# Patient Record
Sex: Female | Born: 1945 | Race: White | Hispanic: No | Marital: Married
Health system: Southern US, Community
[De-identification: ages and names within clinical notes are randomized; demographics above are authoritative.]

---

## 1999-08-16 ENCOUNTER — Ambulatory Visit (HOSPITAL_COMMUNITY): Admission: RE | Admit: 1999-08-16 | Discharge: 1999-08-16 | Payer: Self-pay | Admitting: *Deleted

## 2000-01-03 ENCOUNTER — Encounter: Payer: Self-pay | Admitting: Obstetrics and Gynecology

## 2000-01-03 ENCOUNTER — Encounter: Admission: RE | Admit: 2000-01-03 | Discharge: 2000-01-03 | Payer: Self-pay | Admitting: Obstetrics and Gynecology

## 2001-07-15 ENCOUNTER — Other Ambulatory Visit: Admission: RE | Admit: 2001-07-15 | Discharge: 2001-07-15 | Payer: Self-pay | Admitting: Obstetrics and Gynecology

## 2001-07-28 ENCOUNTER — Encounter: Admission: RE | Admit: 2001-07-28 | Discharge: 2001-07-28 | Payer: Self-pay | Admitting: Obstetrics and Gynecology

## 2001-07-28 ENCOUNTER — Encounter: Payer: Self-pay | Admitting: Obstetrics and Gynecology

## 2003-02-04 ENCOUNTER — Other Ambulatory Visit: Admission: RE | Admit: 2003-02-04 | Discharge: 2003-02-04 | Payer: Self-pay | Admitting: Obstetrics and Gynecology

## 2003-03-15 ENCOUNTER — Encounter: Admission: RE | Admit: 2003-03-15 | Discharge: 2003-03-15 | Payer: Self-pay | Admitting: Obstetrics and Gynecology

## 2003-03-15 ENCOUNTER — Encounter: Payer: Self-pay | Admitting: Obstetrics and Gynecology

## 2004-05-18 ENCOUNTER — Other Ambulatory Visit: Admission: RE | Admit: 2004-05-18 | Discharge: 2004-05-18 | Payer: Self-pay | Admitting: Obstetrics and Gynecology

## 2004-05-18 ENCOUNTER — Encounter: Admission: RE | Admit: 2004-05-18 | Discharge: 2004-05-18 | Payer: Self-pay | Admitting: Obstetrics and Gynecology

## 2005-07-22 ENCOUNTER — Other Ambulatory Visit: Admission: RE | Admit: 2005-07-22 | Discharge: 2005-07-22 | Payer: Self-pay | Admitting: Obstetrics and Gynecology

## 2005-07-23 ENCOUNTER — Encounter: Admission: RE | Admit: 2005-07-23 | Discharge: 2005-07-23 | Payer: Self-pay | Admitting: Obstetrics and Gynecology

## 2006-07-24 ENCOUNTER — Encounter: Admission: RE | Admit: 2006-07-24 | Discharge: 2006-07-24 | Payer: Self-pay | Admitting: Obstetrics and Gynecology

## 2007-07-29 ENCOUNTER — Encounter: Admission: RE | Admit: 2007-07-29 | Discharge: 2007-07-29 | Payer: Self-pay | Admitting: Obstetrics and Gynecology

## 2008-08-01 ENCOUNTER — Encounter: Admission: RE | Admit: 2008-08-01 | Discharge: 2008-08-01 | Payer: Self-pay | Admitting: Obstetrics and Gynecology

## 2009-08-03 ENCOUNTER — Encounter: Admission: RE | Admit: 2009-08-03 | Discharge: 2009-08-03 | Payer: Self-pay | Admitting: Obstetrics and Gynecology

## 2010-08-06 ENCOUNTER — Encounter
Admission: RE | Admit: 2010-08-06 | Discharge: 2010-08-06 | Payer: Self-pay | Source: Home / Self Care | Attending: Obstetrics and Gynecology | Admitting: Obstetrics and Gynecology

## 2011-06-21 ENCOUNTER — Other Ambulatory Visit: Payer: Self-pay | Admitting: Obstetrics and Gynecology

## 2011-06-21 DIAGNOSIS — Z1231 Encounter for screening mammogram for malignant neoplasm of breast: Secondary | ICD-10-CM

## 2011-06-25 ENCOUNTER — Other Ambulatory Visit: Payer: Self-pay | Admitting: Obstetrics and Gynecology

## 2011-06-25 DIAGNOSIS — Z780 Asymptomatic menopausal state: Secondary | ICD-10-CM

## 2011-08-12 ENCOUNTER — Ambulatory Visit
Admission: RE | Admit: 2011-08-12 | Discharge: 2011-08-12 | Disposition: A | Discharge status: Home / Self Care | Payer: No Typology Code available for payment source | Source: Ambulatory Visit | Attending: Obstetrics and Gynecology | Admitting: Obstetrics and Gynecology

## 2011-08-12 ENCOUNTER — Ambulatory Visit: Payer: Self-pay

## 2011-08-12 DIAGNOSIS — Z1231 Encounter for screening mammogram for malignant neoplasm of breast: Secondary | ICD-10-CM

## 2011-08-12 DIAGNOSIS — Z780 Asymptomatic menopausal state: Secondary | ICD-10-CM

## 2011-10-01 DIAGNOSIS — L299 Pruritus, unspecified: Secondary | ICD-10-CM | POA: Diagnosis not present

## 2011-10-01 DIAGNOSIS — E782 Mixed hyperlipidemia: Secondary | ICD-10-CM | POA: Diagnosis not present

## 2011-10-01 DIAGNOSIS — R5381 Other malaise: Secondary | ICD-10-CM | POA: Diagnosis not present

## 2011-10-01 DIAGNOSIS — E119 Type 2 diabetes mellitus without complications: Secondary | ICD-10-CM | POA: Diagnosis not present

## 2012-01-29 DIAGNOSIS — R5381 Other malaise: Secondary | ICD-10-CM | POA: Diagnosis not present

## 2012-01-29 DIAGNOSIS — E119 Type 2 diabetes mellitus without complications: Secondary | ICD-10-CM | POA: Diagnosis not present

## 2012-01-29 DIAGNOSIS — E782 Mixed hyperlipidemia: Secondary | ICD-10-CM | POA: Diagnosis not present

## 2012-01-29 DIAGNOSIS — R5383 Other fatigue: Secondary | ICD-10-CM | POA: Diagnosis not present

## 2012-06-09 DIAGNOSIS — Z23 Encounter for immunization: Secondary | ICD-10-CM | POA: Diagnosis not present

## 2012-06-10 DIAGNOSIS — R82998 Other abnormal findings in urine: Secondary | ICD-10-CM | POA: Diagnosis not present

## 2012-06-10 DIAGNOSIS — R300 Dysuria: Secondary | ICD-10-CM | POA: Diagnosis not present

## 2012-07-20 DIAGNOSIS — Z124 Encounter for screening for malignant neoplasm of cervix: Secondary | ICD-10-CM | POA: Diagnosis not present

## 2012-07-20 DIAGNOSIS — Z01419 Encounter for gynecological examination (general) (routine) without abnormal findings: Secondary | ICD-10-CM | POA: Diagnosis not present

## 2012-07-20 DIAGNOSIS — Z1212 Encounter for screening for malignant neoplasm of rectum: Secondary | ICD-10-CM | POA: Diagnosis not present

## 2012-07-20 DIAGNOSIS — E559 Vitamin D deficiency, unspecified: Secondary | ICD-10-CM | POA: Diagnosis not present

## 2012-07-20 DIAGNOSIS — Z136 Encounter for screening for cardiovascular disorders: Secondary | ICD-10-CM | POA: Diagnosis not present

## 2012-07-20 DIAGNOSIS — E119 Type 2 diabetes mellitus without complications: Secondary | ICD-10-CM | POA: Diagnosis not present

## 2012-07-20 DIAGNOSIS — IMO0002 Reserved for concepts with insufficient information to code with codable children: Secondary | ICD-10-CM | POA: Diagnosis not present

## 2012-07-20 DIAGNOSIS — Z0000 Encounter for general adult medical examination without abnormal findings: Secondary | ICD-10-CM | POA: Diagnosis not present

## 2012-09-24 ENCOUNTER — Other Ambulatory Visit: Payer: Self-pay | Admitting: Internal Medicine

## 2012-09-24 ENCOUNTER — Other Ambulatory Visit: Payer: Self-pay | Admitting: Obstetrics and Gynecology

## 2012-09-24 DIAGNOSIS — Z1231 Encounter for screening mammogram for malignant neoplasm of breast: Secondary | ICD-10-CM

## 2012-09-28 ENCOUNTER — Ambulatory Visit
Admission: RE | Admit: 2012-09-28 | Discharge: 2012-09-28 | Disposition: A | Discharge status: Home / Self Care | Payer: Medicare Other | Source: Ambulatory Visit | Attending: Internal Medicine | Admitting: Internal Medicine

## 2012-09-28 DIAGNOSIS — Z1231 Encounter for screening mammogram for malignant neoplasm of breast: Secondary | ICD-10-CM | POA: Diagnosis not present

## 2012-12-16 DIAGNOSIS — H2510 Age-related nuclear cataract, unspecified eye: Secondary | ICD-10-CM | POA: Diagnosis not present

## 2013-01-04 DIAGNOSIS — Z23 Encounter for immunization: Secondary | ICD-10-CM | POA: Diagnosis not present

## 2013-01-04 DIAGNOSIS — W268XXA Contact with other sharp object(s), not elsewhere classified, initial encounter: Secondary | ICD-10-CM | POA: Diagnosis not present

## 2013-01-04 DIAGNOSIS — S81009A Unspecified open wound, unspecified knee, initial encounter: Secondary | ICD-10-CM | POA: Diagnosis not present

## 2013-02-01 DIAGNOSIS — E119 Type 2 diabetes mellitus without complications: Secondary | ICD-10-CM | POA: Diagnosis not present

## 2013-02-01 DIAGNOSIS — G4700 Insomnia, unspecified: Secondary | ICD-10-CM | POA: Diagnosis not present

## 2013-02-01 DIAGNOSIS — IMO0002 Reserved for concepts with insufficient information to code with codable children: Secondary | ICD-10-CM | POA: Diagnosis not present

## 2013-02-01 DIAGNOSIS — E785 Hyperlipidemia, unspecified: Secondary | ICD-10-CM | POA: Diagnosis not present

## 2013-02-04 DIAGNOSIS — Z0389 Encounter for observation for other suspected diseases and conditions ruled out: Secondary | ICD-10-CM | POA: Diagnosis not present

## 2013-02-04 DIAGNOSIS — Z8249 Family history of ischemic heart disease and other diseases of the circulatory system: Secondary | ICD-10-CM | POA: Diagnosis not present

## 2013-05-26 DIAGNOSIS — Z23 Encounter for immunization: Secondary | ICD-10-CM | POA: Diagnosis not present

## 2013-08-04 DIAGNOSIS — E119 Type 2 diabetes mellitus without complications: Secondary | ICD-10-CM | POA: Diagnosis not present

## 2013-08-04 DIAGNOSIS — Z9181 History of falling: Secondary | ICD-10-CM | POA: Diagnosis not present

## 2013-08-04 DIAGNOSIS — E785 Hyperlipidemia, unspecified: Secondary | ICD-10-CM | POA: Diagnosis not present

## 2013-08-04 DIAGNOSIS — Z1212 Encounter for screening for malignant neoplasm of rectum: Secondary | ICD-10-CM | POA: Diagnosis not present

## 2013-08-04 DIAGNOSIS — Z0000 Encounter for general adult medical examination without abnormal findings: Secondary | ICD-10-CM | POA: Diagnosis not present

## 2013-08-04 DIAGNOSIS — IMO0002 Reserved for concepts with insufficient information to code with codable children: Secondary | ICD-10-CM | POA: Diagnosis not present

## 2013-08-04 DIAGNOSIS — Z1331 Encounter for screening for depression: Secondary | ICD-10-CM | POA: Diagnosis not present

## 2013-09-09 ENCOUNTER — Other Ambulatory Visit: Payer: Self-pay | Admitting: Internal Medicine

## 2013-09-09 ENCOUNTER — Other Ambulatory Visit: Payer: Self-pay

## 2013-09-09 DIAGNOSIS — M8580 Other specified disorders of bone density and structure, unspecified site: Secondary | ICD-10-CM

## 2013-09-09 DIAGNOSIS — Z1231 Encounter for screening mammogram for malignant neoplasm of breast: Secondary | ICD-10-CM

## 2013-10-04 ENCOUNTER — Ambulatory Visit
Admission: RE | Admit: 2013-10-04 | Discharge: 2013-10-04 | Disposition: A | Discharge status: Home / Self Care | Payer: Medicare Other | Source: Ambulatory Visit | Attending: Internal Medicine | Admitting: Internal Medicine

## 2013-10-04 ENCOUNTER — Ambulatory Visit
Admission: RE | Admit: 2013-10-04 | Discharge: 2013-10-04 | Disposition: A | Discharge status: Home / Self Care | Payer: Medicare Other | Source: Ambulatory Visit

## 2013-10-04 DIAGNOSIS — M949 Disorder of cartilage, unspecified: Secondary | ICD-10-CM | POA: Diagnosis not present

## 2013-10-04 DIAGNOSIS — M8580 Other specified disorders of bone density and structure, unspecified site: Secondary | ICD-10-CM

## 2013-10-04 DIAGNOSIS — Z1231 Encounter for screening mammogram for malignant neoplasm of breast: Secondary | ICD-10-CM | POA: Diagnosis not present

## 2013-10-04 DIAGNOSIS — M899 Disorder of bone, unspecified: Secondary | ICD-10-CM | POA: Diagnosis not present

## 2014-02-02 DIAGNOSIS — E785 Hyperlipidemia, unspecified: Secondary | ICD-10-CM | POA: Diagnosis not present

## 2014-02-02 DIAGNOSIS — IMO0002 Reserved for concepts with insufficient information to code with codable children: Secondary | ICD-10-CM | POA: Diagnosis not present

## 2014-02-02 DIAGNOSIS — E119 Type 2 diabetes mellitus without complications: Secondary | ICD-10-CM | POA: Diagnosis not present

## 2014-02-02 DIAGNOSIS — G4700 Insomnia, unspecified: Secondary | ICD-10-CM | POA: Diagnosis not present

## 2014-02-04 DIAGNOSIS — G4700 Insomnia, unspecified: Secondary | ICD-10-CM | POA: Diagnosis not present

## 2014-02-04 DIAGNOSIS — E785 Hyperlipidemia, unspecified: Secondary | ICD-10-CM | POA: Diagnosis not present

## 2014-05-19 DIAGNOSIS — IMO0002 Reserved for concepts with insufficient information to code with codable children: Secondary | ICD-10-CM | POA: Diagnosis not present

## 2014-05-19 DIAGNOSIS — E119 Type 2 diabetes mellitus without complications: Secondary | ICD-10-CM | POA: Diagnosis not present

## 2014-05-19 DIAGNOSIS — E785 Hyperlipidemia, unspecified: Secondary | ICD-10-CM | POA: Diagnosis not present

## 2014-05-19 DIAGNOSIS — E559 Vitamin D deficiency, unspecified: Secondary | ICD-10-CM | POA: Diagnosis not present

## 2014-05-25 DIAGNOSIS — Z23 Encounter for immunization: Secondary | ICD-10-CM | POA: Diagnosis not present

## 2014-08-04 ENCOUNTER — Other Ambulatory Visit: Payer: Self-pay

## 2014-08-04 DIAGNOSIS — Z1231 Encounter for screening mammogram for malignant neoplasm of breast: Secondary | ICD-10-CM

## 2014-08-05 DIAGNOSIS — Z1389 Encounter for screening for other disorder: Secondary | ICD-10-CM | POA: Diagnosis not present

## 2014-08-05 DIAGNOSIS — Z6822 Body mass index (BMI) 22.0-22.9, adult: Secondary | ICD-10-CM | POA: Diagnosis not present

## 2014-08-05 DIAGNOSIS — F5104 Psychophysiologic insomnia: Secondary | ICD-10-CM | POA: Diagnosis not present

## 2014-08-05 DIAGNOSIS — E559 Vitamin D deficiency, unspecified: Secondary | ICD-10-CM | POA: Diagnosis not present

## 2014-08-05 DIAGNOSIS — Z23 Encounter for immunization: Secondary | ICD-10-CM | POA: Diagnosis not present

## 2014-08-05 DIAGNOSIS — E785 Hyperlipidemia, unspecified: Secondary | ICD-10-CM | POA: Diagnosis not present

## 2014-08-05 DIAGNOSIS — E119 Type 2 diabetes mellitus without complications: Secondary | ICD-10-CM | POA: Diagnosis not present

## 2014-08-05 DIAGNOSIS — Z9181 History of falling: Secondary | ICD-10-CM | POA: Diagnosis not present

## 2014-08-12 DIAGNOSIS — Z1212 Encounter for screening for malignant neoplasm of rectum: Secondary | ICD-10-CM | POA: Diagnosis not present

## 2014-09-05 DIAGNOSIS — E785 Hyperlipidemia, unspecified: Secondary | ICD-10-CM | POA: Diagnosis not present

## 2014-09-05 DIAGNOSIS — E119 Type 2 diabetes mellitus without complications: Secondary | ICD-10-CM | POA: Diagnosis not present

## 2014-09-05 DIAGNOSIS — E559 Vitamin D deficiency, unspecified: Secondary | ICD-10-CM | POA: Diagnosis not present

## 2014-10-06 ENCOUNTER — Encounter (INDEPENDENT_AMBULATORY_CARE_PROVIDER_SITE_OTHER): Payer: Self-pay

## 2014-10-06 ENCOUNTER — Ambulatory Visit
Admission: RE | Admit: 2014-10-06 | Discharge: 2014-10-06 | Disposition: A | Discharge status: Home / Self Care | Payer: Medicare Other | Source: Ambulatory Visit

## 2014-10-06 DIAGNOSIS — Z01419 Encounter for gynecological examination (general) (routine) without abnormal findings: Secondary | ICD-10-CM | POA: Diagnosis not present

## 2014-10-06 DIAGNOSIS — Z1231 Encounter for screening mammogram for malignant neoplasm of breast: Secondary | ICD-10-CM

## 2014-10-20 DIAGNOSIS — L570 Actinic keratosis: Secondary | ICD-10-CM | POA: Diagnosis not present

## 2015-01-17 DIAGNOSIS — H40013 Open angle with borderline findings, low risk, bilateral: Secondary | ICD-10-CM | POA: Diagnosis not present

## 2015-01-17 DIAGNOSIS — H524 Presbyopia: Secondary | ICD-10-CM | POA: Diagnosis not present

## 2015-01-17 DIAGNOSIS — H2513 Age-related nuclear cataract, bilateral: Secondary | ICD-10-CM | POA: Diagnosis not present

## 2015-02-21 DIAGNOSIS — Z9181 History of falling: Secondary | ICD-10-CM | POA: Diagnosis not present

## 2015-02-21 DIAGNOSIS — F5104 Psychophysiologic insomnia: Secondary | ICD-10-CM | POA: Diagnosis not present

## 2015-02-21 DIAGNOSIS — E785 Hyperlipidemia, unspecified: Secondary | ICD-10-CM | POA: Diagnosis not present

## 2015-02-21 DIAGNOSIS — Z1389 Encounter for screening for other disorder: Secondary | ICD-10-CM | POA: Diagnosis not present

## 2015-02-21 DIAGNOSIS — E119 Type 2 diabetes mellitus without complications: Secondary | ICD-10-CM | POA: Diagnosis not present

## 2015-02-21 DIAGNOSIS — Z6821 Body mass index (BMI) 21.0-21.9, adult: Secondary | ICD-10-CM | POA: Diagnosis not present

## 2015-02-21 DIAGNOSIS — E559 Vitamin D deficiency, unspecified: Secondary | ICD-10-CM | POA: Diagnosis not present

## 2015-02-24 DIAGNOSIS — L570 Actinic keratosis: Secondary | ICD-10-CM | POA: Diagnosis not present

## 2015-02-24 DIAGNOSIS — Z85828 Personal history of other malignant neoplasm of skin: Secondary | ICD-10-CM | POA: Diagnosis not present

## 2015-02-24 DIAGNOSIS — L821 Other seborrheic keratosis: Secondary | ICD-10-CM | POA: Diagnosis not present

## 2015-02-24 DIAGNOSIS — D0462 Carcinoma in situ of skin of left upper limb, including shoulder: Secondary | ICD-10-CM | POA: Diagnosis not present

## 2015-02-24 DIAGNOSIS — D485 Neoplasm of uncertain behavior of skin: Secondary | ICD-10-CM | POA: Diagnosis not present

## 2015-02-24 DIAGNOSIS — D2371 Other benign neoplasm of skin of right lower limb, including hip: Secondary | ICD-10-CM | POA: Diagnosis not present

## 2015-02-24 DIAGNOSIS — D225 Melanocytic nevi of trunk: Secondary | ICD-10-CM | POA: Diagnosis not present

## 2015-02-24 DIAGNOSIS — Z808 Family history of malignant neoplasm of other organs or systems: Secondary | ICD-10-CM | POA: Diagnosis not present

## 2015-04-06 DIAGNOSIS — D0462 Carcinoma in situ of skin of left upper limb, including shoulder: Secondary | ICD-10-CM | POA: Diagnosis not present

## 2015-06-12 DIAGNOSIS — Z23 Encounter for immunization: Secondary | ICD-10-CM | POA: Diagnosis not present

## 2015-08-25 DIAGNOSIS — F5104 Psychophysiologic insomnia: Secondary | ICD-10-CM | POA: Diagnosis not present

## 2015-08-25 DIAGNOSIS — Z6821 Body mass index (BMI) 21.0-21.9, adult: Secondary | ICD-10-CM | POA: Diagnosis not present

## 2015-08-25 DIAGNOSIS — E559 Vitamin D deficiency, unspecified: Secondary | ICD-10-CM | POA: Diagnosis not present

## 2015-08-25 DIAGNOSIS — E119 Type 2 diabetes mellitus without complications: Secondary | ICD-10-CM | POA: Diagnosis not present

## 2015-08-25 DIAGNOSIS — R636 Underweight: Secondary | ICD-10-CM | POA: Diagnosis not present

## 2015-08-25 DIAGNOSIS — Z7289 Other problems related to lifestyle: Secondary | ICD-10-CM | POA: Diagnosis not present

## 2015-08-25 DIAGNOSIS — Z0000 Encounter for general adult medical examination without abnormal findings: Secondary | ICD-10-CM | POA: Diagnosis not present

## 2015-08-25 DIAGNOSIS — Z1389 Encounter for screening for other disorder: Secondary | ICD-10-CM | POA: Diagnosis not present

## 2015-09-18 ENCOUNTER — Other Ambulatory Visit: Payer: Self-pay

## 2015-09-19 ENCOUNTER — Other Ambulatory Visit: Payer: Self-pay | Admitting: Internal Medicine

## 2015-09-19 DIAGNOSIS — M8580 Other specified disorders of bone density and structure, unspecified site: Secondary | ICD-10-CM

## 2015-09-19 DIAGNOSIS — Z1231 Encounter for screening mammogram for malignant neoplasm of breast: Secondary | ICD-10-CM

## 2015-10-16 ENCOUNTER — Ambulatory Visit
Admission: RE | Admit: 2015-10-16 | Discharge: 2015-10-16 | Disposition: A | Discharge status: Home / Self Care | Payer: Medicare Other | Source: Ambulatory Visit | Attending: Internal Medicine | Admitting: Internal Medicine

## 2015-10-16 DIAGNOSIS — Z1231 Encounter for screening mammogram for malignant neoplasm of breast: Secondary | ICD-10-CM

## 2015-10-16 DIAGNOSIS — M85852 Other specified disorders of bone density and structure, left thigh: Secondary | ICD-10-CM | POA: Diagnosis not present

## 2015-10-16 DIAGNOSIS — M8580 Other specified disorders of bone density and structure, unspecified site: Secondary | ICD-10-CM

## 2015-11-07 DIAGNOSIS — Z808 Family history of malignant neoplasm of other organs or systems: Secondary | ICD-10-CM | POA: Diagnosis not present

## 2015-11-07 DIAGNOSIS — D2239 Melanocytic nevi of other parts of face: Secondary | ICD-10-CM | POA: Diagnosis not present

## 2015-11-07 DIAGNOSIS — D485 Neoplasm of uncertain behavior of skin: Secondary | ICD-10-CM | POA: Diagnosis not present

## 2015-11-07 DIAGNOSIS — D2230 Melanocytic nevi of unspecified part of face: Secondary | ICD-10-CM | POA: Diagnosis not present

## 2015-11-07 DIAGNOSIS — D225 Melanocytic nevi of trunk: Secondary | ICD-10-CM | POA: Diagnosis not present

## 2015-11-07 DIAGNOSIS — L821 Other seborrheic keratosis: Secondary | ICD-10-CM | POA: Diagnosis not present

## 2015-12-12 DIAGNOSIS — L089 Local infection of the skin and subcutaneous tissue, unspecified: Secondary | ICD-10-CM | POA: Diagnosis not present

## 2015-12-12 DIAGNOSIS — D485 Neoplasm of uncertain behavior of skin: Secondary | ICD-10-CM | POA: Diagnosis not present

## 2016-01-31 DIAGNOSIS — H04123 Dry eye syndrome of bilateral lacrimal glands: Secondary | ICD-10-CM | POA: Diagnosis not present

## 2016-01-31 DIAGNOSIS — H5202 Hypermetropia, left eye: Secondary | ICD-10-CM | POA: Diagnosis not present

## 2016-01-31 DIAGNOSIS — H52222 Regular astigmatism, left eye: Secondary | ICD-10-CM | POA: Diagnosis not present

## 2016-01-31 DIAGNOSIS — H2513 Age-related nuclear cataract, bilateral: Secondary | ICD-10-CM | POA: Diagnosis not present

## 2016-01-31 DIAGNOSIS — H40023 Open angle with borderline findings, high risk, bilateral: Secondary | ICD-10-CM | POA: Diagnosis not present

## 2016-01-31 DIAGNOSIS — H524 Presbyopia: Secondary | ICD-10-CM | POA: Diagnosis not present

## 2016-03-08 DIAGNOSIS — Z049 Encounter for examination and observation for unspecified reason: Secondary | ICD-10-CM | POA: Diagnosis not present

## 2016-03-08 DIAGNOSIS — E785 Hyperlipidemia, unspecified: Secondary | ICD-10-CM | POA: Diagnosis not present

## 2016-03-08 DIAGNOSIS — Z6820 Body mass index (BMI) 20.0-20.9, adult: Secondary | ICD-10-CM | POA: Diagnosis not present

## 2016-03-08 DIAGNOSIS — Z9181 History of falling: Secondary | ICD-10-CM | POA: Diagnosis not present

## 2016-03-08 DIAGNOSIS — E119 Type 2 diabetes mellitus without complications: Secondary | ICD-10-CM | POA: Diagnosis not present

## 2016-03-21 DIAGNOSIS — D126 Benign neoplasm of colon, unspecified: Secondary | ICD-10-CM | POA: Diagnosis not present

## 2016-03-21 DIAGNOSIS — D123 Benign neoplasm of transverse colon: Secondary | ICD-10-CM | POA: Diagnosis not present

## 2016-03-21 DIAGNOSIS — K5730 Diverticulosis of large intestine without perforation or abscess without bleeding: Secondary | ICD-10-CM | POA: Diagnosis not present

## 2016-03-21 DIAGNOSIS — Z1211 Encounter for screening for malignant neoplasm of colon: Secondary | ICD-10-CM | POA: Diagnosis not present

## 2016-05-08 DIAGNOSIS — D225 Melanocytic nevi of trunk: Secondary | ICD-10-CM | POA: Diagnosis not present

## 2016-05-08 DIAGNOSIS — L821 Other seborrheic keratosis: Secondary | ICD-10-CM | POA: Diagnosis not present

## 2016-05-08 DIAGNOSIS — D2371 Other benign neoplasm of skin of right lower limb, including hip: Secondary | ICD-10-CM | POA: Diagnosis not present

## 2016-05-08 DIAGNOSIS — Z808 Family history of malignant neoplasm of other organs or systems: Secondary | ICD-10-CM | POA: Diagnosis not present

## 2016-06-05 DIAGNOSIS — Z23 Encounter for immunization: Secondary | ICD-10-CM | POA: Diagnosis not present

## 2016-08-30 ENCOUNTER — Other Ambulatory Visit: Payer: Self-pay | Admitting: Obstetrics and Gynecology

## 2016-08-30 DIAGNOSIS — Z1231 Encounter for screening mammogram for malignant neoplasm of breast: Secondary | ICD-10-CM

## 2016-09-18 DIAGNOSIS — F5104 Psychophysiologic insomnia: Secondary | ICD-10-CM | POA: Diagnosis not present

## 2016-09-18 DIAGNOSIS — Z1389 Encounter for screening for other disorder: Secondary | ICD-10-CM | POA: Diagnosis not present

## 2016-09-18 DIAGNOSIS — E785 Hyperlipidemia, unspecified: Secondary | ICD-10-CM | POA: Diagnosis not present

## 2016-09-18 DIAGNOSIS — Z9181 History of falling: Secondary | ICD-10-CM | POA: Diagnosis not present

## 2016-09-18 DIAGNOSIS — E559 Vitamin D deficiency, unspecified: Secondary | ICD-10-CM | POA: Diagnosis not present

## 2016-09-18 DIAGNOSIS — E119 Type 2 diabetes mellitus without complications: Secondary | ICD-10-CM | POA: Diagnosis not present

## 2016-10-16 ENCOUNTER — Ambulatory Visit
Admission: RE | Admit: 2016-10-16 | Discharge: 2016-10-16 | Disposition: A | Discharge status: Home / Self Care | Payer: Medicare Other | Source: Ambulatory Visit | Attending: Obstetrics and Gynecology | Admitting: Obstetrics and Gynecology

## 2016-10-16 DIAGNOSIS — Z1231 Encounter for screening mammogram for malignant neoplasm of breast: Secondary | ICD-10-CM

## 2016-10-16 DIAGNOSIS — Z01419 Encounter for gynecological examination (general) (routine) without abnormal findings: Secondary | ICD-10-CM | POA: Diagnosis not present

## 2016-12-25 DIAGNOSIS — D485 Neoplasm of uncertain behavior of skin: Secondary | ICD-10-CM | POA: Diagnosis not present

## 2016-12-25 DIAGNOSIS — D045 Carcinoma in situ of skin of trunk: Secondary | ICD-10-CM | POA: Diagnosis not present

## 2016-12-31 DIAGNOSIS — J45909 Unspecified asthma, uncomplicated: Secondary | ICD-10-CM | POA: Diagnosis not present

## 2017-02-04 DIAGNOSIS — H2513 Age-related nuclear cataract, bilateral: Secondary | ICD-10-CM | POA: Diagnosis not present

## 2017-02-04 DIAGNOSIS — H04123 Dry eye syndrome of bilateral lacrimal glands: Secondary | ICD-10-CM | POA: Diagnosis not present

## 2017-02-04 DIAGNOSIS — H40023 Open angle with borderline findings, high risk, bilateral: Secondary | ICD-10-CM | POA: Diagnosis not present

## 2017-02-04 DIAGNOSIS — E119 Type 2 diabetes mellitus without complications: Secondary | ICD-10-CM | POA: Diagnosis not present

## 2017-02-04 DIAGNOSIS — H524 Presbyopia: Secondary | ICD-10-CM | POA: Diagnosis not present

## 2017-02-20 DIAGNOSIS — D045 Carcinoma in situ of skin of trunk: Secondary | ICD-10-CM | POA: Diagnosis not present

## 2017-03-19 DIAGNOSIS — E785 Hyperlipidemia, unspecified: Secondary | ICD-10-CM | POA: Diagnosis not present

## 2017-03-19 DIAGNOSIS — F5104 Psychophysiologic insomnia: Secondary | ICD-10-CM | POA: Diagnosis not present

## 2017-03-19 DIAGNOSIS — E559 Vitamin D deficiency, unspecified: Secondary | ICD-10-CM | POA: Diagnosis not present

## 2017-03-19 DIAGNOSIS — E119 Type 2 diabetes mellitus without complications: Secondary | ICD-10-CM | POA: Diagnosis not present

## 2017-04-30 DIAGNOSIS — R030 Elevated blood-pressure reading, without diagnosis of hypertension: Secondary | ICD-10-CM | POA: Diagnosis not present

## 2017-04-30 DIAGNOSIS — Z6820 Body mass index (BMI) 20.0-20.9, adult: Secondary | ICD-10-CM | POA: Diagnosis not present

## 2017-05-13 DIAGNOSIS — D2371 Other benign neoplasm of skin of right lower limb, including hip: Secondary | ICD-10-CM | POA: Diagnosis not present

## 2017-05-13 DIAGNOSIS — L910 Hypertrophic scar: Secondary | ICD-10-CM | POA: Diagnosis not present

## 2017-05-13 DIAGNOSIS — L821 Other seborrheic keratosis: Secondary | ICD-10-CM | POA: Diagnosis not present

## 2017-05-13 DIAGNOSIS — D225 Melanocytic nevi of trunk: Secondary | ICD-10-CM | POA: Diagnosis not present

## 2017-05-13 DIAGNOSIS — Z23 Encounter for immunization: Secondary | ICD-10-CM | POA: Diagnosis not present

## 2017-05-13 DIAGNOSIS — Z808 Family history of malignant neoplasm of other organs or systems: Secondary | ICD-10-CM | POA: Diagnosis not present

## 2017-06-23 DIAGNOSIS — Z23 Encounter for immunization: Secondary | ICD-10-CM | POA: Diagnosis not present

## 2017-09-02 DIAGNOSIS — Z23 Encounter for immunization: Secondary | ICD-10-CM | POA: Diagnosis not present

## 2017-09-02 DIAGNOSIS — Z0000 Encounter for general adult medical examination without abnormal findings: Secondary | ICD-10-CM | POA: Diagnosis not present

## 2017-09-02 DIAGNOSIS — E785 Hyperlipidemia, unspecified: Secondary | ICD-10-CM | POA: Diagnosis not present

## 2017-09-02 DIAGNOSIS — Z1231 Encounter for screening mammogram for malignant neoplasm of breast: Secondary | ICD-10-CM | POA: Diagnosis not present

## 2017-09-02 DIAGNOSIS — Z1331 Encounter for screening for depression: Secondary | ICD-10-CM | POA: Diagnosis not present

## 2017-09-02 DIAGNOSIS — Z9181 History of falling: Secondary | ICD-10-CM | POA: Diagnosis not present

## 2017-09-29 ENCOUNTER — Other Ambulatory Visit: Payer: Self-pay | Admitting: Internal Medicine

## 2017-09-29 ENCOUNTER — Other Ambulatory Visit: Payer: Self-pay | Admitting: Obstetrics and Gynecology

## 2017-09-29 DIAGNOSIS — Z1231 Encounter for screening mammogram for malignant neoplasm of breast: Secondary | ICD-10-CM

## 2017-09-29 DIAGNOSIS — M8589 Other specified disorders of bone density and structure, multiple sites: Secondary | ICD-10-CM

## 2017-10-21 ENCOUNTER — Ambulatory Visit
Admission: RE | Admit: 2017-10-21 | Discharge: 2017-10-21 | Disposition: A | Discharge status: Home / Self Care | Payer: Medicare Other | Source: Ambulatory Visit | Attending: Obstetrics and Gynecology | Admitting: Obstetrics and Gynecology

## 2017-10-21 ENCOUNTER — Ambulatory Visit
Admission: RE | Admit: 2017-10-21 | Discharge: 2017-10-21 | Disposition: A | Discharge status: Home / Self Care | Payer: Medicare Other | Source: Ambulatory Visit | Attending: Internal Medicine | Admitting: Internal Medicine

## 2017-10-21 DIAGNOSIS — Z1231 Encounter for screening mammogram for malignant neoplasm of breast: Secondary | ICD-10-CM

## 2017-10-21 DIAGNOSIS — M8589 Other specified disorders of bone density and structure, multiple sites: Secondary | ICD-10-CM | POA: Diagnosis not present

## 2017-10-21 DIAGNOSIS — Z780 Asymptomatic menopausal state: Secondary | ICD-10-CM | POA: Diagnosis not present

## 2017-10-30 DIAGNOSIS — E785 Hyperlipidemia, unspecified: Secondary | ICD-10-CM | POA: Diagnosis not present

## 2017-10-30 DIAGNOSIS — Z6820 Body mass index (BMI) 20.0-20.9, adult: Secondary | ICD-10-CM | POA: Diagnosis not present

## 2017-10-30 DIAGNOSIS — F5104 Psychophysiologic insomnia: Secondary | ICD-10-CM | POA: Diagnosis not present

## 2017-10-30 DIAGNOSIS — E119 Type 2 diabetes mellitus without complications: Secondary | ICD-10-CM | POA: Diagnosis not present

## 2017-10-30 DIAGNOSIS — M8580 Other specified disorders of bone density and structure, unspecified site: Secondary | ICD-10-CM | POA: Diagnosis not present

## 2018-01-02 DIAGNOSIS — L309 Dermatitis, unspecified: Secondary | ICD-10-CM | POA: Diagnosis not present

## 2018-01-02 DIAGNOSIS — L03011 Cellulitis of right finger: Secondary | ICD-10-CM | POA: Diagnosis not present

## 2018-02-05 DIAGNOSIS — L03011 Cellulitis of right finger: Secondary | ICD-10-CM | POA: Diagnosis not present

## 2018-02-10 DIAGNOSIS — E119 Type 2 diabetes mellitus without complications: Secondary | ICD-10-CM | POA: Diagnosis not present

## 2018-02-10 DIAGNOSIS — H04123 Dry eye syndrome of bilateral lacrimal glands: Secondary | ICD-10-CM | POA: Diagnosis not present

## 2018-02-10 DIAGNOSIS — H40023 Open angle with borderline findings, high risk, bilateral: Secondary | ICD-10-CM | POA: Diagnosis not present

## 2018-02-10 DIAGNOSIS — H2513 Age-related nuclear cataract, bilateral: Secondary | ICD-10-CM | POA: Diagnosis not present

## 2018-02-10 DIAGNOSIS — H524 Presbyopia: Secondary | ICD-10-CM | POA: Diagnosis not present

## 2018-03-12 DIAGNOSIS — L03011 Cellulitis of right finger: Secondary | ICD-10-CM | POA: Diagnosis not present

## 2018-05-07 DIAGNOSIS — E785 Hyperlipidemia, unspecified: Secondary | ICD-10-CM | POA: Diagnosis not present

## 2018-05-07 DIAGNOSIS — E119 Type 2 diabetes mellitus without complications: Secondary | ICD-10-CM | POA: Diagnosis not present

## 2018-05-07 DIAGNOSIS — Z1339 Encounter for screening examination for other mental health and behavioral disorders: Secondary | ICD-10-CM | POA: Diagnosis not present

## 2018-05-07 DIAGNOSIS — F5104 Psychophysiologic insomnia: Secondary | ICD-10-CM | POA: Diagnosis not present

## 2018-05-07 DIAGNOSIS — M8580 Other specified disorders of bone density and structure, unspecified site: Secondary | ICD-10-CM | POA: Diagnosis not present

## 2018-05-26 DIAGNOSIS — Z23 Encounter for immunization: Secondary | ICD-10-CM | POA: Diagnosis not present

## 2018-05-27 DIAGNOSIS — L821 Other seborrheic keratosis: Secondary | ICD-10-CM | POA: Diagnosis not present

## 2018-05-27 DIAGNOSIS — D2371 Other benign neoplasm of skin of right lower limb, including hip: Secondary | ICD-10-CM | POA: Diagnosis not present

## 2018-05-27 DIAGNOSIS — D225 Melanocytic nevi of trunk: Secondary | ICD-10-CM | POA: Diagnosis not present

## 2018-05-27 DIAGNOSIS — Z808 Family history of malignant neoplasm of other organs or systems: Secondary | ICD-10-CM | POA: Diagnosis not present

## 2018-05-27 DIAGNOSIS — Z23 Encounter for immunization: Secondary | ICD-10-CM | POA: Diagnosis not present

## 2018-08-25 ENCOUNTER — Other Ambulatory Visit: Payer: Self-pay | Admitting: Obstetrics and Gynecology

## 2018-08-25 DIAGNOSIS — Z1231 Encounter for screening mammogram for malignant neoplasm of breast: Secondary | ICD-10-CM

## 2018-09-03 DIAGNOSIS — Z1239 Encounter for other screening for malignant neoplasm of breast: Secondary | ICD-10-CM | POA: Diagnosis not present

## 2018-09-03 DIAGNOSIS — N959 Unspecified menopausal and perimenopausal disorder: Secondary | ICD-10-CM | POA: Diagnosis not present

## 2018-09-03 DIAGNOSIS — Z1331 Encounter for screening for depression: Secondary | ICD-10-CM | POA: Diagnosis not present

## 2018-09-03 DIAGNOSIS — Z136 Encounter for screening for cardiovascular disorders: Secondary | ICD-10-CM | POA: Diagnosis not present

## 2018-09-03 DIAGNOSIS — Z9181 History of falling: Secondary | ICD-10-CM | POA: Diagnosis not present

## 2018-09-03 DIAGNOSIS — Z0000 Encounter for general adult medical examination without abnormal findings: Secondary | ICD-10-CM | POA: Diagnosis not present

## 2018-09-03 DIAGNOSIS — E785 Hyperlipidemia, unspecified: Secondary | ICD-10-CM | POA: Diagnosis not present

## 2018-10-29 ENCOUNTER — Ambulatory Visit
Admission: RE | Admit: 2018-10-29 | Discharge: 2018-10-29 | Disposition: A | Discharge status: Home / Self Care | Payer: Medicare Other | Source: Ambulatory Visit | Attending: Obstetrics and Gynecology | Admitting: Obstetrics and Gynecology

## 2018-10-29 DIAGNOSIS — Z1231 Encounter for screening mammogram for malignant neoplasm of breast: Secondary | ICD-10-CM

## 2018-10-29 DIAGNOSIS — Z01419 Encounter for gynecological examination (general) (routine) without abnormal findings: Secondary | ICD-10-CM | POA: Diagnosis not present

## 2018-11-02 IMAGING — MG 2D DIGITAL SCREENING BILATERAL MAMMOGRAM WITH CAD AND ADJUNCT TO
8 of 12 series · 8 of 28 positions shown · non-contrast
Comparison: Previous exam(s).

CLINICAL DATA: Screening.

EXAM:
2D DIGITAL SCREENING BILATERAL MAMMOGRAM WITH CAD AND ADJUNCT TOMO

[R MLO synth-2D]
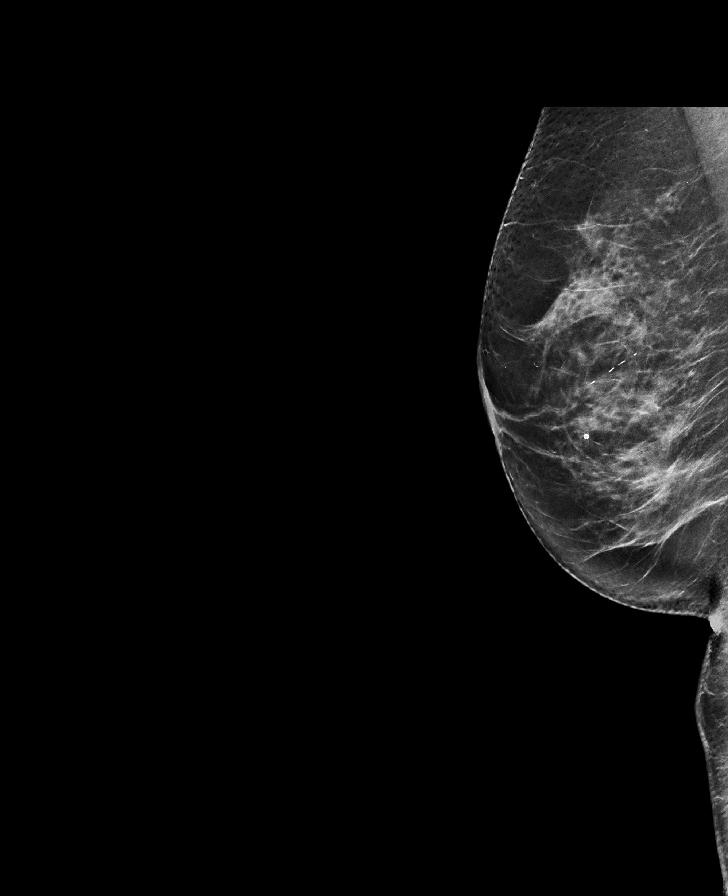

[R CC]
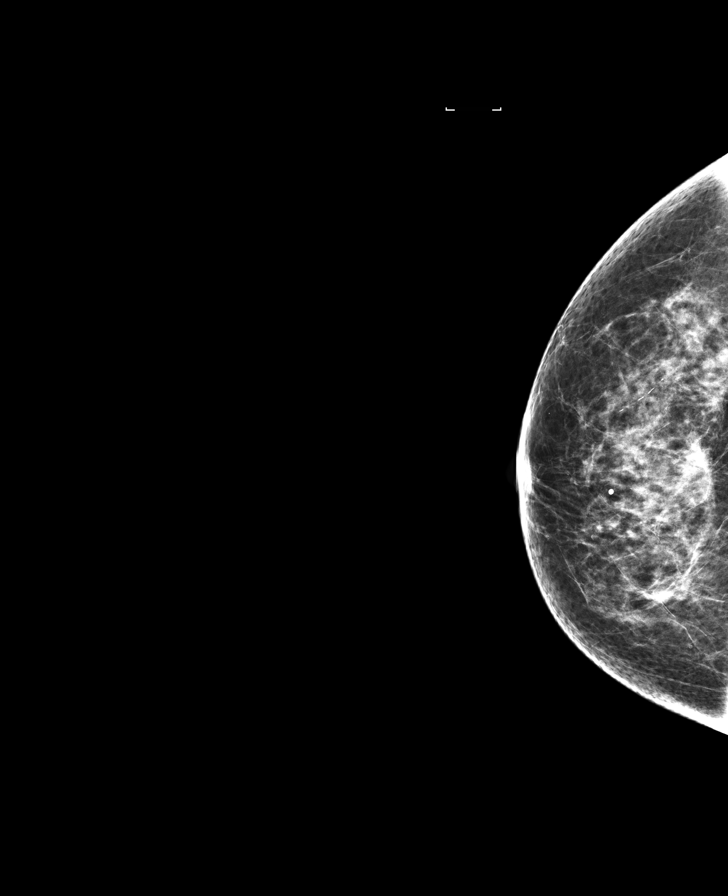

[L MLO synth-2D]
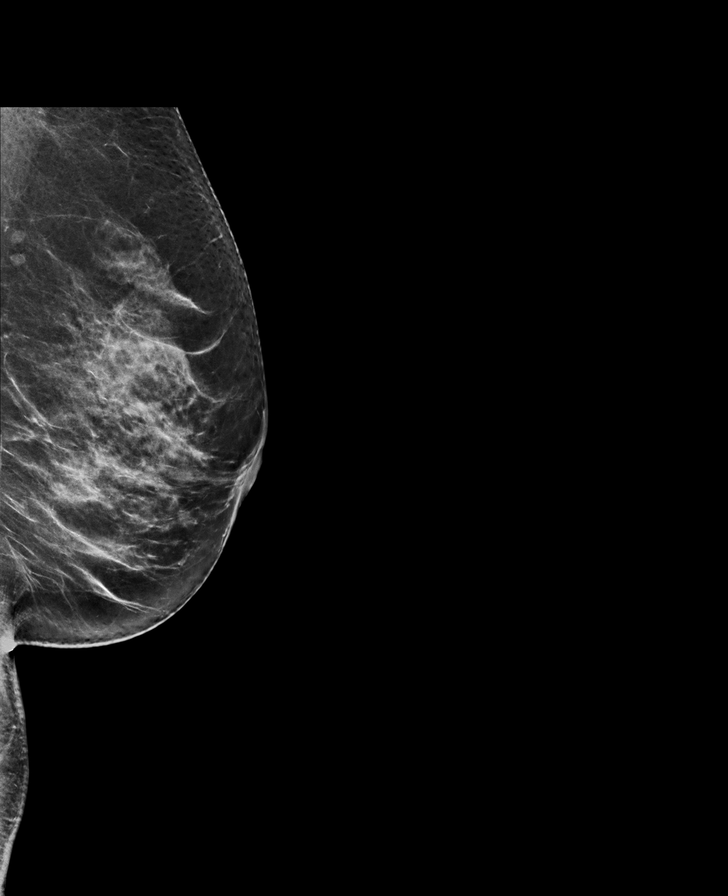

[R CC synth-2D]
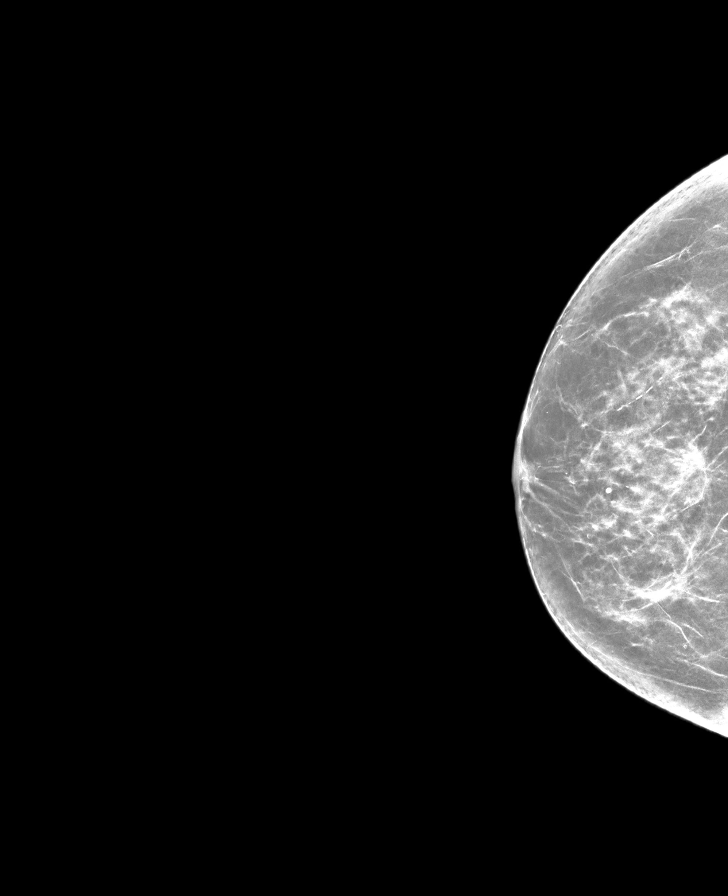

[L MLO]
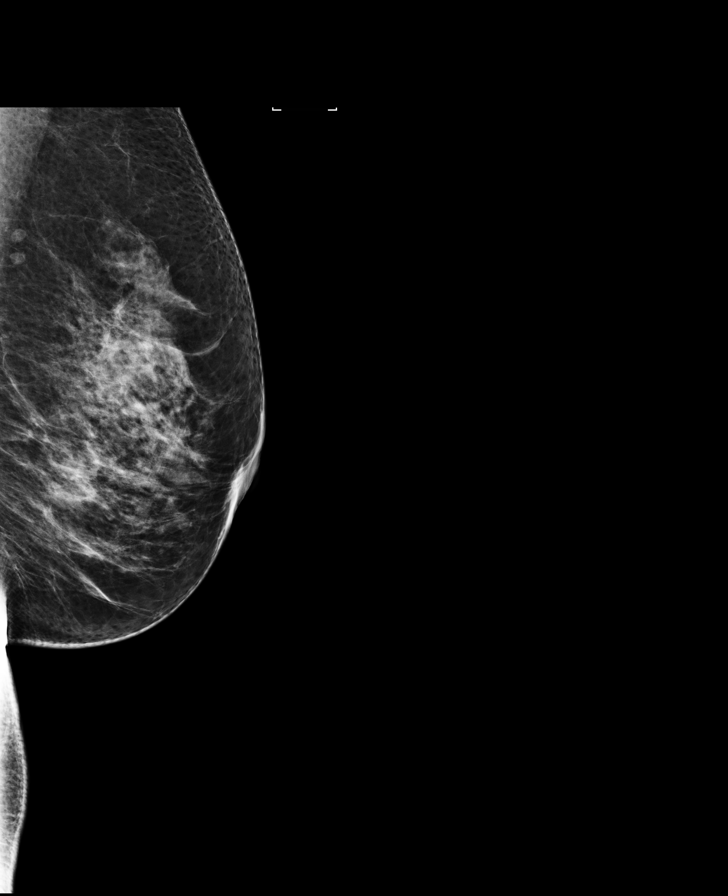

[L CC synth-2D]
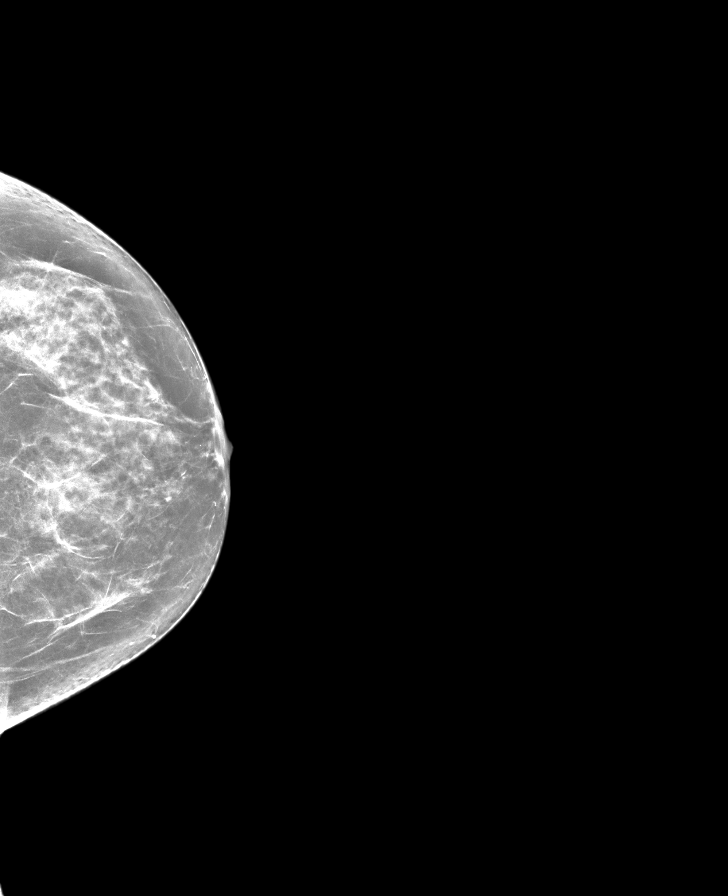

[L CC]
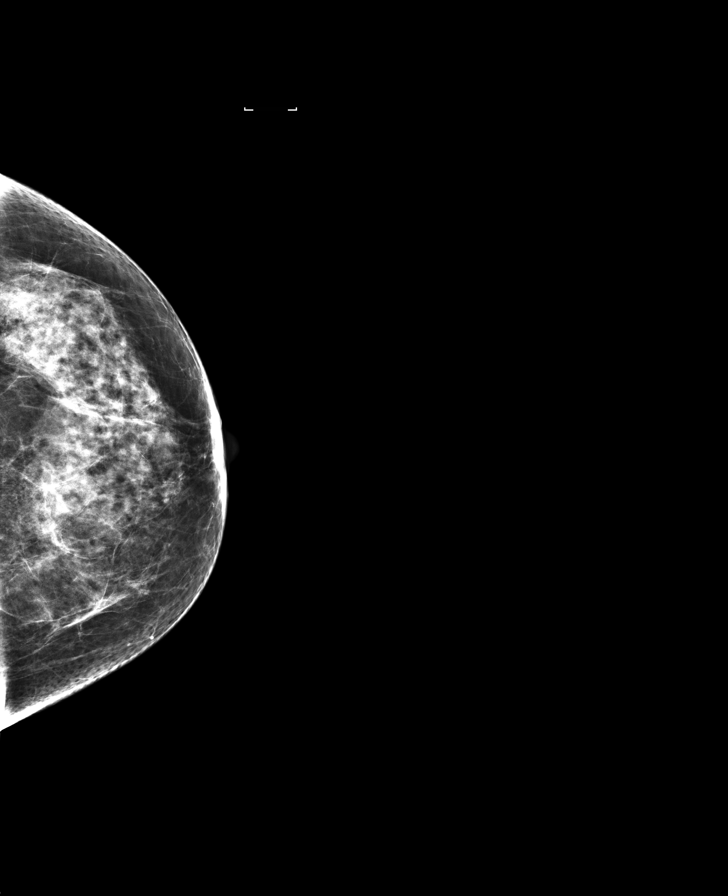

[R MLO]
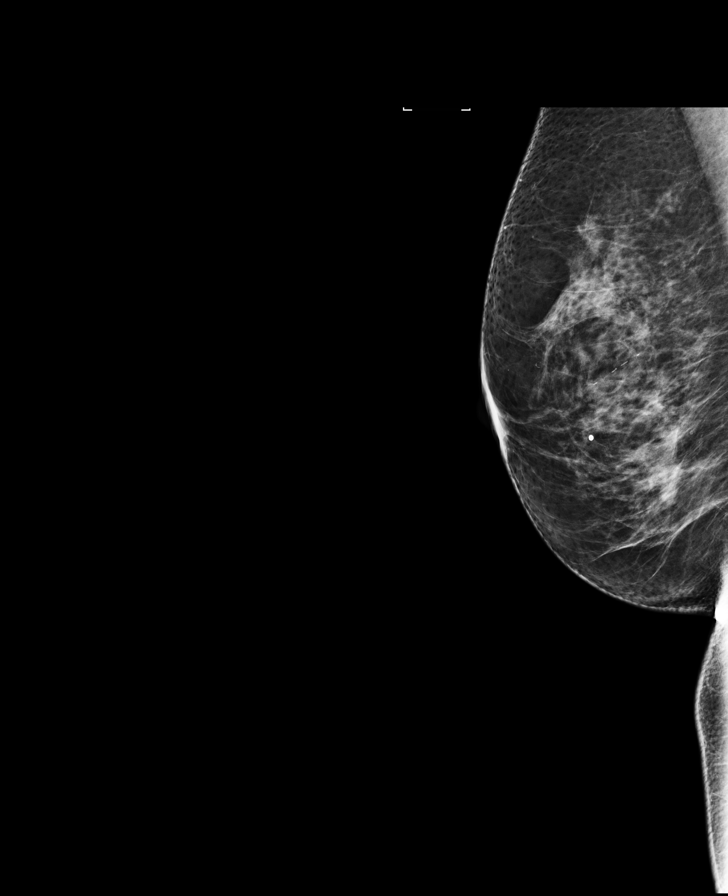

[8 of 28 positions shown; findings below may reference images not displayed]

ACR Breast Density Category c: The breast tissue is heterogeneously
dense, which may obscure small masses.
FINDINGS: There are no findings suspicious for malignancy. Images were
processed with CAD.
IMPRESSION: No mammographic evidence of malignancy. A result letter of this
screening mammogram will be mailed directly to the patient.

RECOMMENDATION:
Screening mammogram in one year. (Code:[id number])

BI-RADS CATEGORY  1: Negative.

## 2018-11-12 DIAGNOSIS — E119 Type 2 diabetes mellitus without complications: Secondary | ICD-10-CM | POA: Diagnosis not present

## 2018-11-12 DIAGNOSIS — E785 Hyperlipidemia, unspecified: Secondary | ICD-10-CM | POA: Diagnosis not present

## 2018-11-12 DIAGNOSIS — Z6821 Body mass index (BMI) 21.0-21.9, adult: Secondary | ICD-10-CM | POA: Diagnosis not present

## 2018-11-12 DIAGNOSIS — F5104 Psychophysiologic insomnia: Secondary | ICD-10-CM | POA: Diagnosis not present

## 2019-05-25 DIAGNOSIS — Z23 Encounter for immunization: Secondary | ICD-10-CM | POA: Diagnosis not present

## 2019-05-28 DIAGNOSIS — E119 Type 2 diabetes mellitus without complications: Secondary | ICD-10-CM | POA: Diagnosis not present

## 2019-05-28 DIAGNOSIS — E559 Vitamin D deficiency, unspecified: Secondary | ICD-10-CM | POA: Diagnosis not present

## 2019-05-28 DIAGNOSIS — F419 Anxiety disorder, unspecified: Secondary | ICD-10-CM | POA: Diagnosis not present

## 2019-05-28 DIAGNOSIS — E785 Hyperlipidemia, unspecified: Secondary | ICD-10-CM | POA: Diagnosis not present

## 2019-06-09 DIAGNOSIS — Z85828 Personal history of other malignant neoplasm of skin: Secondary | ICD-10-CM | POA: Diagnosis not present

## 2019-06-09 DIAGNOSIS — Z808 Family history of malignant neoplasm of other organs or systems: Secondary | ICD-10-CM | POA: Diagnosis not present

## 2019-06-09 DIAGNOSIS — Z23 Encounter for immunization: Secondary | ICD-10-CM | POA: Diagnosis not present

## 2019-06-09 DIAGNOSIS — D2261 Melanocytic nevi of right upper limb, including shoulder: Secondary | ICD-10-CM | POA: Diagnosis not present

## 2019-06-09 DIAGNOSIS — L821 Other seborrheic keratosis: Secondary | ICD-10-CM | POA: Diagnosis not present

## 2019-06-09 DIAGNOSIS — D225 Melanocytic nevi of trunk: Secondary | ICD-10-CM | POA: Diagnosis not present

## 2019-06-09 DIAGNOSIS — L817 Pigmented purpuric dermatosis: Secondary | ICD-10-CM | POA: Diagnosis not present

## 2019-09-06 DIAGNOSIS — E785 Hyperlipidemia, unspecified: Secondary | ICD-10-CM | POA: Diagnosis not present

## 2019-09-06 DIAGNOSIS — Z139 Encounter for screening, unspecified: Secondary | ICD-10-CM | POA: Diagnosis not present

## 2019-09-06 DIAGNOSIS — Z1211 Encounter for screening for malignant neoplasm of colon: Secondary | ICD-10-CM | POA: Diagnosis not present

## 2019-09-06 DIAGNOSIS — Z1231 Encounter for screening mammogram for malignant neoplasm of breast: Secondary | ICD-10-CM | POA: Diagnosis not present

## 2019-09-06 DIAGNOSIS — Z1331 Encounter for screening for depression: Secondary | ICD-10-CM | POA: Diagnosis not present

## 2019-09-06 DIAGNOSIS — Z9181 History of falling: Secondary | ICD-10-CM | POA: Diagnosis not present

## 2019-09-06 DIAGNOSIS — Z0000 Encounter for general adult medical examination without abnormal findings: Secondary | ICD-10-CM | POA: Diagnosis not present

## 2019-09-06 DIAGNOSIS — N959 Unspecified menopausal and perimenopausal disorder: Secondary | ICD-10-CM | POA: Diagnosis not present

## 2019-09-18 ENCOUNTER — Telehealth: Payer: Self-pay | Admitting: *Deleted

## 2019-09-18 NOTE — Telephone Encounter (Signed)
Pt put on waiting list for the first covid vaccine.

## 2019-09-21 ENCOUNTER — Ambulatory Visit: Payer: Medicare Other

## 2019-09-28 ENCOUNTER — Other Ambulatory Visit: Payer: Self-pay | Admitting: Obstetrics and Gynecology

## 2019-09-28 DIAGNOSIS — Z1231 Encounter for screening mammogram for malignant neoplasm of breast: Secondary | ICD-10-CM

## 2019-09-30 ENCOUNTER — Ambulatory Visit: Payer: Medicare Other | Attending: Internal Medicine

## 2019-09-30 DIAGNOSIS — Z23 Encounter for immunization: Secondary | ICD-10-CM | POA: Insufficient documentation

## 2019-09-30 NOTE — Progress Notes (Signed)
   Covid-19 Vaccination Clinic  Name:  Stephanie Mccullough    MRN: [id number] DOB: 1946/03/20  09/30/2019  [name] was observed post Covid-19 immunization for 15 minutes without incidence. She was provided with Vaccine Information Sheet and instruction to access the V-Safe system.   [name] was instructed to call 911 with any severe reactions post vaccine: Marland Kitchen Difficulty breathing  . Swelling of your face and throat  . A fast heartbeat  . A bad rash all over your body  . Dizziness and weakness    Immunizations Administered    Name Date Dose VIS Date Route   Pfizer COVID-19 Vaccine 09/30/2019  8:38 AM 0.3 mL 08/06/2019 Intramuscular   Manufacturer: [place]   Lot: [id number]   [place]: [id number]

## 2019-10-08 ENCOUNTER — Other Ambulatory Visit: Payer: Self-pay | Admitting: Internal Medicine

## 2019-10-08 DIAGNOSIS — R5381 Other malaise: Secondary | ICD-10-CM

## 2019-10-12 ENCOUNTER — Other Ambulatory Visit: Payer: Self-pay | Admitting: Internal Medicine

## 2019-10-12 DIAGNOSIS — M8580 Other specified disorders of bone density and structure, unspecified site: Secondary | ICD-10-CM

## 2019-10-25 ENCOUNTER — Ambulatory Visit: Payer: Medicare Other | Attending: Internal Medicine

## 2019-10-25 DIAGNOSIS — Z23 Encounter for immunization: Secondary | ICD-10-CM | POA: Insufficient documentation

## 2019-10-25 NOTE — Progress Notes (Signed)
   Covid-19 Vaccination Clinic  Name:  Stephanie Mccullough    MRN: [id number] DOB: 1945-12-17  10/25/2019  Stephanie Mccullough was observed post Covid-19 immunization for 15 minutes without incidence. She was provided with Vaccine Information Sheet and instruction to access the V-Safe system.   Stephanie Mccullough was instructed to call 911 with any severe reactions post vaccine: Marland Kitchen Difficulty breathing  . Swelling of your face and throat  . A fast heartbeat  . A bad rash all over your body  . Dizziness and weakness    Immunizations Administered    Name Date Dose VIS Date Route   Pfizer COVID-19 Vaccine 10/25/2019  1:08 PM 0.3 mL 08/06/2019 Intramuscular   Manufacturer: [place]   Lot: [id number]   [place]: [id number]

## 2019-12-09 DIAGNOSIS — E785 Hyperlipidemia, unspecified: Secondary | ICD-10-CM | POA: Diagnosis not present

## 2019-12-09 DIAGNOSIS — E119 Type 2 diabetes mellitus without complications: Secondary | ICD-10-CM | POA: Diagnosis not present

## 2019-12-09 DIAGNOSIS — M79672 Pain in left foot: Secondary | ICD-10-CM | POA: Diagnosis not present

## 2019-12-09 DIAGNOSIS — F419 Anxiety disorder, unspecified: Secondary | ICD-10-CM | POA: Diagnosis not present

## 2019-12-16 ENCOUNTER — Other Ambulatory Visit: Payer: Self-pay

## 2019-12-16 ENCOUNTER — Ambulatory Visit
Admission: RE | Admit: 2019-12-16 | Discharge: 2019-12-16 | Disposition: A | Discharge status: Home / Self Care | Payer: Medicare Other | Source: Ambulatory Visit | Attending: Internal Medicine | Admitting: Internal Medicine

## 2019-12-16 ENCOUNTER — Ambulatory Visit
Admission: RE | Admit: 2019-12-16 | Discharge: 2019-12-16 | Disposition: A | Discharge status: Home / Self Care | Payer: Medicare Other | Source: Ambulatory Visit | Attending: Obstetrics and Gynecology | Admitting: Obstetrics and Gynecology

## 2019-12-16 DIAGNOSIS — M8589 Other specified disorders of bone density and structure, multiple sites: Secondary | ICD-10-CM | POA: Diagnosis not present

## 2019-12-16 DIAGNOSIS — Z1231 Encounter for screening mammogram for malignant neoplasm of breast: Secondary | ICD-10-CM | POA: Diagnosis not present

## 2019-12-16 DIAGNOSIS — Z780 Asymptomatic menopausal state: Secondary | ICD-10-CM | POA: Diagnosis not present

## 2019-12-16 DIAGNOSIS — M8580 Other specified disorders of bone density and structure, unspecified site: Secondary | ICD-10-CM

## 2019-12-17 DIAGNOSIS — L578 Other skin changes due to chronic exposure to nonionizing radiation: Secondary | ICD-10-CM | POA: Diagnosis not present

## 2019-12-17 DIAGNOSIS — L570 Actinic keratosis: Secondary | ICD-10-CM | POA: Diagnosis not present

## 2019-12-17 DIAGNOSIS — L821 Other seborrheic keratosis: Secondary | ICD-10-CM | POA: Diagnosis not present

## 2019-12-31 DIAGNOSIS — H524 Presbyopia: Secondary | ICD-10-CM | POA: Diagnosis not present

## 2019-12-31 DIAGNOSIS — H2513 Age-related nuclear cataract, bilateral: Secondary | ICD-10-CM | POA: Diagnosis not present

## 2019-12-31 DIAGNOSIS — H04123 Dry eye syndrome of bilateral lacrimal glands: Secondary | ICD-10-CM | POA: Diagnosis not present

## 2019-12-31 DIAGNOSIS — E119 Type 2 diabetes mellitus without complications: Secondary | ICD-10-CM | POA: Diagnosis not present

## 2019-12-31 DIAGNOSIS — H40013 Open angle with borderline findings, low risk, bilateral: Secondary | ICD-10-CM | POA: Diagnosis not present

## 2020-04-26 DIAGNOSIS — Z23 Encounter for immunization: Secondary | ICD-10-CM | POA: Diagnosis not present

## 2020-05-19 DIAGNOSIS — Z23 Encounter for immunization: Secondary | ICD-10-CM | POA: Diagnosis not present

## 2020-05-24 DIAGNOSIS — L570 Actinic keratosis: Secondary | ICD-10-CM | POA: Diagnosis not present

## 2020-06-09 DIAGNOSIS — Z6820 Body mass index (BMI) 20.0-20.9, adult: Secondary | ICD-10-CM | POA: Diagnosis not present

## 2020-06-09 DIAGNOSIS — E559 Vitamin D deficiency, unspecified: Secondary | ICD-10-CM | POA: Diagnosis not present

## 2020-06-09 DIAGNOSIS — F419 Anxiety disorder, unspecified: Secondary | ICD-10-CM | POA: Diagnosis not present

## 2020-06-09 DIAGNOSIS — E785 Hyperlipidemia, unspecified: Secondary | ICD-10-CM | POA: Diagnosis not present

## 2020-06-09 DIAGNOSIS — E119 Type 2 diabetes mellitus without complications: Secondary | ICD-10-CM | POA: Diagnosis not present

## 2020-07-12 DIAGNOSIS — M795 Residual foreign body in soft tissue: Secondary | ICD-10-CM | POA: Diagnosis not present

## 2020-07-12 DIAGNOSIS — L578 Other skin changes due to chronic exposure to nonionizing radiation: Secondary | ICD-10-CM | POA: Diagnosis not present

## 2020-07-12 DIAGNOSIS — C44311 Basal cell carcinoma of skin of nose: Secondary | ICD-10-CM | POA: Diagnosis not present

## 2020-07-12 DIAGNOSIS — Z808 Family history of malignant neoplasm of other organs or systems: Secondary | ICD-10-CM | POA: Diagnosis not present

## 2020-07-12 DIAGNOSIS — D225 Melanocytic nevi of trunk: Secondary | ICD-10-CM | POA: Diagnosis not present

## 2020-07-12 DIAGNOSIS — D2261 Melanocytic nevi of right upper limb, including shoulder: Secondary | ICD-10-CM | POA: Diagnosis not present

## 2020-07-12 DIAGNOSIS — D485 Neoplasm of uncertain behavior of skin: Secondary | ICD-10-CM | POA: Diagnosis not present

## 2020-07-12 DIAGNOSIS — L720 Epidermal cyst: Secondary | ICD-10-CM | POA: Diagnosis not present

## 2020-07-12 DIAGNOSIS — D0461 Carcinoma in situ of skin of right upper limb, including shoulder: Secondary | ICD-10-CM | POA: Diagnosis not present

## 2020-07-12 DIAGNOSIS — L821 Other seborrheic keratosis: Secondary | ICD-10-CM | POA: Diagnosis not present

## 2020-07-12 DIAGNOSIS — Z85828 Personal history of other malignant neoplasm of skin: Secondary | ICD-10-CM | POA: Diagnosis not present

## 2020-08-11 DIAGNOSIS — D485 Neoplasm of uncertain behavior of skin: Secondary | ICD-10-CM | POA: Diagnosis not present

## 2020-08-11 DIAGNOSIS — C44311 Basal cell carcinoma of skin of nose: Secondary | ICD-10-CM | POA: Diagnosis not present

## 2020-08-11 DIAGNOSIS — D0461 Carcinoma in situ of skin of right upper limb, including shoulder: Secondary | ICD-10-CM | POA: Diagnosis not present

## 2020-09-06 DIAGNOSIS — Z0000 Encounter for general adult medical examination without abnormal findings: Secondary | ICD-10-CM | POA: Diagnosis not present

## 2020-09-06 DIAGNOSIS — Z1331 Encounter for screening for depression: Secondary | ICD-10-CM | POA: Diagnosis not present

## 2020-09-06 DIAGNOSIS — Z9181 History of falling: Secondary | ICD-10-CM | POA: Diagnosis not present

## 2020-09-06 DIAGNOSIS — E785 Hyperlipidemia, unspecified: Secondary | ICD-10-CM | POA: Diagnosis not present

## 2020-09-07 DIAGNOSIS — C44311 Basal cell carcinoma of skin of nose: Secondary | ICD-10-CM | POA: Diagnosis not present

## 2020-09-07 DIAGNOSIS — I781 Nevus, non-neoplastic: Secondary | ICD-10-CM | POA: Diagnosis not present

## 2020-09-07 DIAGNOSIS — L700 Acne vulgaris: Secondary | ICD-10-CM | POA: Diagnosis not present

## 2020-09-27 ENCOUNTER — Other Ambulatory Visit: Payer: Self-pay | Admitting: Obstetrics and Gynecology

## 2020-09-27 DIAGNOSIS — Z1231 Encounter for screening mammogram for malignant neoplasm of breast: Secondary | ICD-10-CM

## 2020-10-04 DIAGNOSIS — Z23 Encounter for immunization: Secondary | ICD-10-CM | POA: Diagnosis not present

## 2020-10-09 DIAGNOSIS — C4401 Basal cell carcinoma of skin of lip: Secondary | ICD-10-CM | POA: Diagnosis not present

## 2020-12-14 DIAGNOSIS — F419 Anxiety disorder, unspecified: Secondary | ICD-10-CM | POA: Diagnosis not present

## 2020-12-14 DIAGNOSIS — F5104 Psychophysiologic insomnia: Secondary | ICD-10-CM | POA: Diagnosis not present

## 2020-12-14 DIAGNOSIS — E119 Type 2 diabetes mellitus without complications: Secondary | ICD-10-CM | POA: Diagnosis not present

## 2020-12-14 DIAGNOSIS — E785 Hyperlipidemia, unspecified: Secondary | ICD-10-CM | POA: Diagnosis not present

## 2020-12-14 DIAGNOSIS — E1165 Type 2 diabetes mellitus with hyperglycemia: Secondary | ICD-10-CM | POA: Diagnosis not present

## 2020-12-14 DIAGNOSIS — Z85828 Personal history of other malignant neoplasm of skin: Secondary | ICD-10-CM | POA: Diagnosis not present

## 2020-12-14 DIAGNOSIS — Z6820 Body mass index (BMI) 20.0-20.9, adult: Secondary | ICD-10-CM | POA: Diagnosis not present

## 2020-12-14 DIAGNOSIS — E559 Vitamin D deficiency, unspecified: Secondary | ICD-10-CM | POA: Diagnosis not present

## 2020-12-20 ENCOUNTER — Other Ambulatory Visit: Payer: Self-pay

## 2020-12-20 ENCOUNTER — Ambulatory Visit
Admission: RE | Admit: 2020-12-20 | Discharge: 2020-12-20 | Disposition: A | Discharge status: Home / Self Care | Payer: Medicare Other | Source: Ambulatory Visit | Attending: Obstetrics and Gynecology | Admitting: Obstetrics and Gynecology

## 2020-12-20 DIAGNOSIS — Z1231 Encounter for screening mammogram for malignant neoplasm of breast: Secondary | ICD-10-CM

## 2020-12-20 DIAGNOSIS — Z01419 Encounter for gynecological examination (general) (routine) without abnormal findings: Secondary | ICD-10-CM | POA: Diagnosis not present

## 2020-12-26 DIAGNOSIS — D225 Melanocytic nevi of trunk: Secondary | ICD-10-CM | POA: Diagnosis not present

## 2020-12-26 DIAGNOSIS — L570 Actinic keratosis: Secondary | ICD-10-CM | POA: Diagnosis not present

## 2020-12-26 DIAGNOSIS — Z85828 Personal history of other malignant neoplasm of skin: Secondary | ICD-10-CM | POA: Diagnosis not present

## 2020-12-26 DIAGNOSIS — L578 Other skin changes due to chronic exposure to nonionizing radiation: Secondary | ICD-10-CM | POA: Diagnosis not present

## 2020-12-26 DIAGNOSIS — D2261 Melanocytic nevi of right upper limb, including shoulder: Secondary | ICD-10-CM | POA: Diagnosis not present

## 2020-12-26 DIAGNOSIS — L821 Other seborrheic keratosis: Secondary | ICD-10-CM | POA: Diagnosis not present

## 2021-01-11 DIAGNOSIS — H40013 Open angle with borderline findings, low risk, bilateral: Secondary | ICD-10-CM | POA: Diagnosis not present

## 2021-01-11 DIAGNOSIS — H25813 Combined forms of age-related cataract, bilateral: Secondary | ICD-10-CM | POA: Diagnosis not present

## 2021-01-11 DIAGNOSIS — E119 Type 2 diabetes mellitus without complications: Secondary | ICD-10-CM | POA: Diagnosis not present

## 2021-01-11 DIAGNOSIS — H04123 Dry eye syndrome of bilateral lacrimal glands: Secondary | ICD-10-CM | POA: Diagnosis not present

## 2021-01-11 DIAGNOSIS — H524 Presbyopia: Secondary | ICD-10-CM | POA: Diagnosis not present

## 2021-03-16 DIAGNOSIS — Z23 Encounter for immunization: Secondary | ICD-10-CM | POA: Diagnosis not present

## 2021-05-30 DIAGNOSIS — K649 Unspecified hemorrhoids: Secondary | ICD-10-CM | POA: Diagnosis not present

## 2021-05-30 DIAGNOSIS — Z86010 Personal history of colonic polyps: Secondary | ICD-10-CM | POA: Diagnosis not present

## 2021-05-30 DIAGNOSIS — K5730 Diverticulosis of large intestine without perforation or abscess without bleeding: Secondary | ICD-10-CM | POA: Diagnosis not present

## 2021-05-31 DIAGNOSIS — Z23 Encounter for immunization: Secondary | ICD-10-CM | POA: Diagnosis not present

## 2021-06-13 DIAGNOSIS — Z23 Encounter for immunization: Secondary | ICD-10-CM | POA: Diagnosis not present

## 2021-06-18 DIAGNOSIS — E785 Hyperlipidemia, unspecified: Secondary | ICD-10-CM | POA: Diagnosis not present

## 2021-06-18 DIAGNOSIS — F419 Anxiety disorder, unspecified: Secondary | ICD-10-CM | POA: Diagnosis not present

## 2021-06-18 DIAGNOSIS — M1990 Unspecified osteoarthritis, unspecified site: Secondary | ICD-10-CM | POA: Diagnosis not present

## 2021-06-18 DIAGNOSIS — E559 Vitamin D deficiency, unspecified: Secondary | ICD-10-CM | POA: Diagnosis not present

## 2021-06-18 DIAGNOSIS — E1165 Type 2 diabetes mellitus with hyperglycemia: Secondary | ICD-10-CM | POA: Diagnosis not present

## 2021-06-18 DIAGNOSIS — F5104 Psychophysiologic insomnia: Secondary | ICD-10-CM | POA: Diagnosis not present

## 2021-06-18 DIAGNOSIS — Z6820 Body mass index (BMI) 20.0-20.9, adult: Secondary | ICD-10-CM | POA: Diagnosis not present

## 2021-11-13 ENCOUNTER — Other Ambulatory Visit: Payer: Self-pay | Admitting: Obstetrics and Gynecology

## 2021-11-13 DIAGNOSIS — Z1231 Encounter for screening mammogram for malignant neoplasm of breast: Secondary | ICD-10-CM

## 2021-12-17 DIAGNOSIS — E559 Vitamin D deficiency, unspecified: Secondary | ICD-10-CM | POA: Diagnosis not present

## 2021-12-17 DIAGNOSIS — E538 Deficiency of other specified B group vitamins: Secondary | ICD-10-CM | POA: Diagnosis not present

## 2021-12-17 DIAGNOSIS — F419 Anxiety disorder, unspecified: Secondary | ICD-10-CM | POA: Diagnosis not present

## 2021-12-17 DIAGNOSIS — F5104 Psychophysiologic insomnia: Secondary | ICD-10-CM | POA: Diagnosis not present

## 2021-12-17 DIAGNOSIS — Z1331 Encounter for screening for depression: Secondary | ICD-10-CM | POA: Diagnosis not present

## 2021-12-17 DIAGNOSIS — E785 Hyperlipidemia, unspecified: Secondary | ICD-10-CM | POA: Diagnosis not present

## 2021-12-17 DIAGNOSIS — M1990 Unspecified osteoarthritis, unspecified site: Secondary | ICD-10-CM | POA: Diagnosis not present

## 2021-12-17 DIAGNOSIS — Z9181 History of falling: Secondary | ICD-10-CM | POA: Diagnosis not present

## 2021-12-17 DIAGNOSIS — E1165 Type 2 diabetes mellitus with hyperglycemia: Secondary | ICD-10-CM | POA: Diagnosis not present

## 2021-12-18 DIAGNOSIS — Z23 Encounter for immunization: Secondary | ICD-10-CM | POA: Diagnosis not present

## 2021-12-19 DIAGNOSIS — Z1331 Encounter for screening for depression: Secondary | ICD-10-CM | POA: Diagnosis not present

## 2021-12-19 DIAGNOSIS — Z0000 Encounter for general adult medical examination without abnormal findings: Secondary | ICD-10-CM | POA: Diagnosis not present

## 2021-12-19 DIAGNOSIS — Z139 Encounter for screening, unspecified: Secondary | ICD-10-CM | POA: Diagnosis not present

## 2021-12-19 DIAGNOSIS — Z9181 History of falling: Secondary | ICD-10-CM | POA: Diagnosis not present

## 2021-12-19 DIAGNOSIS — E785 Hyperlipidemia, unspecified: Secondary | ICD-10-CM | POA: Diagnosis not present

## 2021-12-21 ENCOUNTER — Ambulatory Visit
Admission: RE | Admit: 2021-12-21 | Discharge: 2021-12-21 | Disposition: A | Discharge status: Home / Self Care | Payer: Medicare Other | Source: Ambulatory Visit | Attending: Obstetrics and Gynecology | Admitting: Obstetrics and Gynecology

## 2021-12-21 DIAGNOSIS — Z1231 Encounter for screening mammogram for malignant neoplasm of breast: Secondary | ICD-10-CM | POA: Diagnosis not present

## 2021-12-28 DIAGNOSIS — Z808 Family history of malignant neoplasm of other organs or systems: Secondary | ICD-10-CM | POA: Diagnosis not present

## 2021-12-28 DIAGNOSIS — L578 Other skin changes due to chronic exposure to nonionizing radiation: Secondary | ICD-10-CM | POA: Diagnosis not present

## 2021-12-28 DIAGNOSIS — L719 Rosacea, unspecified: Secondary | ICD-10-CM | POA: Diagnosis not present

## 2021-12-28 DIAGNOSIS — L309 Dermatitis, unspecified: Secondary | ICD-10-CM | POA: Diagnosis not present

## 2021-12-28 DIAGNOSIS — Z85828 Personal history of other malignant neoplasm of skin: Secondary | ICD-10-CM | POA: Diagnosis not present

## 2021-12-28 DIAGNOSIS — D2371 Other benign neoplasm of skin of right lower limb, including hip: Secondary | ICD-10-CM | POA: Diagnosis not present

## 2021-12-28 DIAGNOSIS — L821 Other seborrheic keratosis: Secondary | ICD-10-CM | POA: Diagnosis not present

## 2021-12-28 DIAGNOSIS — D2261 Melanocytic nevi of right upper limb, including shoulder: Secondary | ICD-10-CM | POA: Diagnosis not present

## 2021-12-28 DIAGNOSIS — D225 Melanocytic nevi of trunk: Secondary | ICD-10-CM | POA: Diagnosis not present

## 2022-01-31 DIAGNOSIS — E119 Type 2 diabetes mellitus without complications: Secondary | ICD-10-CM | POA: Diagnosis not present

## 2022-01-31 DIAGNOSIS — H524 Presbyopia: Secondary | ICD-10-CM | POA: Diagnosis not present

## 2022-01-31 DIAGNOSIS — H40013 Open angle with borderline findings, low risk, bilateral: Secondary | ICD-10-CM | POA: Diagnosis not present

## 2022-01-31 DIAGNOSIS — H04123 Dry eye syndrome of bilateral lacrimal glands: Secondary | ICD-10-CM | POA: Diagnosis not present

## 2022-01-31 DIAGNOSIS — H25813 Combined forms of age-related cataract, bilateral: Secondary | ICD-10-CM | POA: Diagnosis not present

## 2022-02-27 DIAGNOSIS — H25813 Combined forms of age-related cataract, bilateral: Secondary | ICD-10-CM | POA: Diagnosis not present

## 2022-02-27 DIAGNOSIS — H25812 Combined forms of age-related cataract, left eye: Secondary | ICD-10-CM | POA: Diagnosis not present

## 2022-03-19 DIAGNOSIS — H269 Unspecified cataract: Secondary | ICD-10-CM | POA: Diagnosis not present

## 2022-03-19 DIAGNOSIS — H25812 Combined forms of age-related cataract, left eye: Secondary | ICD-10-CM | POA: Diagnosis not present

## 2022-03-19 DIAGNOSIS — H268 Other specified cataract: Secondary | ICD-10-CM | POA: Diagnosis not present

## 2022-03-28 DIAGNOSIS — H25811 Combined forms of age-related cataract, right eye: Secondary | ICD-10-CM | POA: Diagnosis not present

## 2022-04-16 DIAGNOSIS — H269 Unspecified cataract: Secondary | ICD-10-CM | POA: Diagnosis not present

## 2022-04-16 DIAGNOSIS — H25811 Combined forms of age-related cataract, right eye: Secondary | ICD-10-CM | POA: Diagnosis not present

## 2022-05-19 DIAGNOSIS — Z23 Encounter for immunization: Secondary | ICD-10-CM | POA: Diagnosis not present

## 2022-05-23 DIAGNOSIS — L719 Rosacea, unspecified: Secondary | ICD-10-CM | POA: Diagnosis not present

## 2022-05-23 DIAGNOSIS — L821 Other seborrheic keratosis: Secondary | ICD-10-CM | POA: Diagnosis not present

## 2022-05-23 DIAGNOSIS — L570 Actinic keratosis: Secondary | ICD-10-CM | POA: Diagnosis not present

## 2022-06-04 DIAGNOSIS — Z23 Encounter for immunization: Secondary | ICD-10-CM | POA: Diagnosis not present

## 2022-06-18 DIAGNOSIS — Z1231 Encounter for screening mammogram for malignant neoplasm of breast: Secondary | ICD-10-CM | POA: Diagnosis not present

## 2022-06-18 DIAGNOSIS — E1165 Type 2 diabetes mellitus with hyperglycemia: Secondary | ICD-10-CM | POA: Diagnosis not present

## 2022-06-18 DIAGNOSIS — E785 Hyperlipidemia, unspecified: Secondary | ICD-10-CM | POA: Diagnosis not present

## 2022-06-18 DIAGNOSIS — M8589 Other specified disorders of bone density and structure, multiple sites: Secondary | ICD-10-CM | POA: Diagnosis not present

## 2022-06-18 DIAGNOSIS — Z23 Encounter for immunization: Secondary | ICD-10-CM | POA: Diagnosis not present

## 2022-06-18 DIAGNOSIS — F5104 Psychophysiologic insomnia: Secondary | ICD-10-CM | POA: Diagnosis not present

## 2022-06-18 DIAGNOSIS — F419 Anxiety disorder, unspecified: Secondary | ICD-10-CM | POA: Diagnosis not present

## 2022-06-18 DIAGNOSIS — E559 Vitamin D deficiency, unspecified: Secondary | ICD-10-CM | POA: Diagnosis not present

## 2022-06-18 DIAGNOSIS — E538 Deficiency of other specified B group vitamins: Secondary | ICD-10-CM | POA: Diagnosis not present

## 2022-06-18 DIAGNOSIS — M1990 Unspecified osteoarthritis, unspecified site: Secondary | ICD-10-CM | POA: Diagnosis not present

## 2022-06-24 ENCOUNTER — Other Ambulatory Visit: Payer: Self-pay | Admitting: Internal Medicine

## 2022-06-24 DIAGNOSIS — Z1231 Encounter for screening mammogram for malignant neoplasm of breast: Secondary | ICD-10-CM

## 2022-06-24 DIAGNOSIS — R5381 Other malaise: Secondary | ICD-10-CM

## 2022-06-24 DIAGNOSIS — M8580 Other specified disorders of bone density and structure, unspecified site: Secondary | ICD-10-CM

## 2022-12-19 DIAGNOSIS — E538 Deficiency of other specified B group vitamins: Secondary | ICD-10-CM | POA: Diagnosis not present

## 2022-12-19 DIAGNOSIS — E1165 Type 2 diabetes mellitus with hyperglycemia: Secondary | ICD-10-CM | POA: Diagnosis not present

## 2022-12-19 DIAGNOSIS — E785 Hyperlipidemia, unspecified: Secondary | ICD-10-CM | POA: Diagnosis not present

## 2022-12-19 DIAGNOSIS — F5104 Psychophysiologic insomnia: Secondary | ICD-10-CM | POA: Diagnosis not present

## 2022-12-19 DIAGNOSIS — M1990 Unspecified osteoarthritis, unspecified site: Secondary | ICD-10-CM | POA: Diagnosis not present

## 2022-12-19 DIAGNOSIS — M8580 Other specified disorders of bone density and structure, unspecified site: Secondary | ICD-10-CM | POA: Diagnosis not present

## 2022-12-23 ENCOUNTER — Ambulatory Visit
Admission: RE | Admit: 2022-12-23 | Discharge: 2022-12-23 | Disposition: A | Discharge status: Home / Self Care | Payer: Medicare Other | Source: Ambulatory Visit | Attending: Internal Medicine | Admitting: Internal Medicine

## 2022-12-23 DIAGNOSIS — Z1231 Encounter for screening mammogram for malignant neoplasm of breast: Secondary | ICD-10-CM | POA: Diagnosis not present

## 2022-12-23 DIAGNOSIS — Z01419 Encounter for gynecological examination (general) (routine) without abnormal findings: Secondary | ICD-10-CM | POA: Diagnosis not present

## 2022-12-23 DIAGNOSIS — M8589 Other specified disorders of bone density and structure, multiple sites: Secondary | ICD-10-CM | POA: Diagnosis not present

## 2022-12-23 DIAGNOSIS — Z780 Asymptomatic menopausal state: Secondary | ICD-10-CM | POA: Diagnosis not present

## 2022-12-23 DIAGNOSIS — M8580 Other specified disorders of bone density and structure, unspecified site: Secondary | ICD-10-CM

## 2023-01-07 DIAGNOSIS — D2371 Other benign neoplasm of skin of right lower limb, including hip: Secondary | ICD-10-CM | POA: Diagnosis not present

## 2023-01-07 DIAGNOSIS — Z85828 Personal history of other malignant neoplasm of skin: Secondary | ICD-10-CM | POA: Diagnosis not present

## 2023-01-07 DIAGNOSIS — Z808 Family history of malignant neoplasm of other organs or systems: Secondary | ICD-10-CM | POA: Diagnosis not present

## 2023-01-07 DIAGNOSIS — D2261 Melanocytic nevi of right upper limb, including shoulder: Secondary | ICD-10-CM | POA: Diagnosis not present

## 2023-01-07 DIAGNOSIS — D485 Neoplasm of uncertain behavior of skin: Secondary | ICD-10-CM | POA: Diagnosis not present

## 2023-01-07 DIAGNOSIS — L578 Other skin changes due to chronic exposure to nonionizing radiation: Secondary | ICD-10-CM | POA: Diagnosis not present

## 2023-01-07 DIAGNOSIS — L821 Other seborrheic keratosis: Secondary | ICD-10-CM | POA: Diagnosis not present

## 2023-01-07 DIAGNOSIS — D225 Melanocytic nevi of trunk: Secondary | ICD-10-CM | POA: Diagnosis not present

## 2023-01-07 DIAGNOSIS — L719 Rosacea, unspecified: Secondary | ICD-10-CM | POA: Diagnosis not present

## 2023-03-06 DIAGNOSIS — H04123 Dry eye syndrome of bilateral lacrimal glands: Secondary | ICD-10-CM | POA: Diagnosis not present

## 2023-03-06 DIAGNOSIS — H524 Presbyopia: Secondary | ICD-10-CM | POA: Diagnosis not present

## 2023-03-06 DIAGNOSIS — H25813 Combined forms of age-related cataract, bilateral: Secondary | ICD-10-CM | POA: Diagnosis not present

## 2023-03-06 DIAGNOSIS — H40013 Open angle with borderline findings, low risk, bilateral: Secondary | ICD-10-CM | POA: Diagnosis not present

## 2023-03-06 DIAGNOSIS — E119 Type 2 diabetes mellitus without complications: Secondary | ICD-10-CM | POA: Diagnosis not present

## 2023-03-11 DIAGNOSIS — D485 Neoplasm of uncertain behavior of skin: Secondary | ICD-10-CM | POA: Diagnosis not present

## 2023-03-11 DIAGNOSIS — L988 Other specified disorders of the skin and subcutaneous tissue: Secondary | ICD-10-CM | POA: Diagnosis not present

## 2023-03-27 DIAGNOSIS — E785 Hyperlipidemia, unspecified: Secondary | ICD-10-CM | POA: Diagnosis not present

## 2023-03-27 DIAGNOSIS — F5104 Psychophysiologic insomnia: Secondary | ICD-10-CM | POA: Diagnosis not present

## 2023-03-27 DIAGNOSIS — Z139 Encounter for screening, unspecified: Secondary | ICD-10-CM | POA: Diagnosis not present

## 2023-03-27 DIAGNOSIS — E1165 Type 2 diabetes mellitus with hyperglycemia: Secondary | ICD-10-CM | POA: Diagnosis not present

## 2023-03-27 DIAGNOSIS — M8580 Other specified disorders of bone density and structure, unspecified site: Secondary | ICD-10-CM | POA: Diagnosis not present

## 2023-03-27 DIAGNOSIS — F419 Anxiety disorder, unspecified: Secondary | ICD-10-CM | POA: Diagnosis not present

## 2023-03-27 DIAGNOSIS — Z9181 History of falling: Secondary | ICD-10-CM | POA: Diagnosis not present

## 2023-04-11 DIAGNOSIS — E875 Hyperkalemia: Secondary | ICD-10-CM | POA: Diagnosis not present

## 2023-04-28 DIAGNOSIS — Z23 Encounter for immunization: Secondary | ICD-10-CM | POA: Diagnosis not present

## 2023-05-19 DIAGNOSIS — Z23 Encounter for immunization: Secondary | ICD-10-CM | POA: Diagnosis not present

## 2023-10-09 ENCOUNTER — Other Ambulatory Visit: Payer: Self-pay | Admitting: Internal Medicine

## 2023-10-09 DIAGNOSIS — Z1231 Encounter for screening mammogram for malignant neoplasm of breast: Secondary | ICD-10-CM

## 2023-10-13 DIAGNOSIS — E875 Hyperkalemia: Secondary | ICD-10-CM | POA: Diagnosis not present

## 2023-10-13 DIAGNOSIS — E559 Vitamin D deficiency, unspecified: Secondary | ICD-10-CM | POA: Diagnosis not present

## 2023-10-13 DIAGNOSIS — Z681 Body mass index (BMI) 19 or less, adult: Secondary | ICD-10-CM | POA: Diagnosis not present

## 2023-10-13 DIAGNOSIS — E785 Hyperlipidemia, unspecified: Secondary | ICD-10-CM | POA: Diagnosis not present

## 2023-10-13 DIAGNOSIS — M8580 Other specified disorders of bone density and structure, unspecified site: Secondary | ICD-10-CM | POA: Diagnosis not present

## 2023-10-13 DIAGNOSIS — E1165 Type 2 diabetes mellitus with hyperglycemia: Secondary | ICD-10-CM | POA: Diagnosis not present

## 2023-10-14 ENCOUNTER — Other Ambulatory Visit (HOSPITAL_BASED_OUTPATIENT_CLINIC_OR_DEPARTMENT_OTHER): Payer: Self-pay

## 2023-10-14 MED ORDER — SODIUM POLYSTYRENE SULFONATE 15 GM/60ML CO SUSP
0 refills | Status: AC
  Filled 2023-10-14: qty 60, 1d supply, fill #0

## 2023-10-20 DIAGNOSIS — E875 Hyperkalemia: Secondary | ICD-10-CM | POA: Diagnosis not present

## 2023-10-21 DIAGNOSIS — Z9181 History of falling: Secondary | ICD-10-CM | POA: Diagnosis not present

## 2023-10-21 DIAGNOSIS — Z0000 Encounter for general adult medical examination without abnormal findings: Secondary | ICD-10-CM | POA: Diagnosis not present

## 2023-12-24 ENCOUNTER — Ambulatory Visit
Admission: RE | Admit: 2023-12-24 | Discharge: 2023-12-24 | Disposition: A | Discharge status: Home / Self Care | Payer: Medicare Other | Source: Ambulatory Visit | Attending: Internal Medicine | Admitting: Internal Medicine

## 2023-12-24 DIAGNOSIS — Z1231 Encounter for screening mammogram for malignant neoplasm of breast: Secondary | ICD-10-CM

## 2024-01-29 DIAGNOSIS — D2261 Melanocytic nevi of right upper limb, including shoulder: Secondary | ICD-10-CM | POA: Diagnosis not present

## 2024-01-29 DIAGNOSIS — Z808 Family history of malignant neoplasm of other organs or systems: Secondary | ICD-10-CM | POA: Diagnosis not present

## 2024-01-29 DIAGNOSIS — L821 Other seborrheic keratosis: Secondary | ICD-10-CM | POA: Diagnosis not present

## 2024-01-29 DIAGNOSIS — D2371 Other benign neoplasm of skin of right lower limb, including hip: Secondary | ICD-10-CM | POA: Diagnosis not present

## 2024-01-29 DIAGNOSIS — Z85828 Personal history of other malignant neoplasm of skin: Secondary | ICD-10-CM | POA: Diagnosis not present

## 2024-01-29 DIAGNOSIS — D225 Melanocytic nevi of trunk: Secondary | ICD-10-CM | POA: Diagnosis not present

## 2024-01-29 DIAGNOSIS — Z23 Encounter for immunization: Secondary | ICD-10-CM | POA: Diagnosis not present

## 2024-01-29 DIAGNOSIS — L905 Scar conditions and fibrosis of skin: Secondary | ICD-10-CM | POA: Diagnosis not present

## 2024-01-29 DIAGNOSIS — L578 Other skin changes due to chronic exposure to nonionizing radiation: Secondary | ICD-10-CM | POA: Diagnosis not present

## 2024-01-29 DIAGNOSIS — D485 Neoplasm of uncertain behavior of skin: Secondary | ICD-10-CM | POA: Diagnosis not present

## 2024-03-11 DIAGNOSIS — H04123 Dry eye syndrome of bilateral lacrimal glands: Secondary | ICD-10-CM | POA: Diagnosis not present

## 2024-03-11 DIAGNOSIS — E119 Type 2 diabetes mellitus without complications: Secondary | ICD-10-CM | POA: Diagnosis not present

## 2024-03-11 DIAGNOSIS — H40013 Open angle with borderline findings, low risk, bilateral: Secondary | ICD-10-CM | POA: Diagnosis not present

## 2024-03-11 DIAGNOSIS — Z961 Presence of intraocular lens: Secondary | ICD-10-CM | POA: Diagnosis not present

## 2024-04-13 DIAGNOSIS — F419 Anxiety disorder, unspecified: Secondary | ICD-10-CM | POA: Diagnosis not present

## 2024-04-13 DIAGNOSIS — Z681 Body mass index (BMI) 19 or less, adult: Secondary | ICD-10-CM | POA: Diagnosis not present

## 2024-04-13 DIAGNOSIS — F5104 Psychophysiologic insomnia: Secondary | ICD-10-CM | POA: Diagnosis not present

## 2024-04-13 DIAGNOSIS — E559 Vitamin D deficiency, unspecified: Secondary | ICD-10-CM | POA: Diagnosis not present

## 2024-04-13 DIAGNOSIS — M8580 Other specified disorders of bone density and structure, unspecified site: Secondary | ICD-10-CM | POA: Diagnosis not present

## 2024-04-13 DIAGNOSIS — E875 Hyperkalemia: Secondary | ICD-10-CM | POA: Diagnosis not present

## 2024-04-13 DIAGNOSIS — E1165 Type 2 diabetes mellitus with hyperglycemia: Secondary | ICD-10-CM | POA: Diagnosis not present

## 2024-04-13 DIAGNOSIS — E785 Hyperlipidemia, unspecified: Secondary | ICD-10-CM | POA: Diagnosis not present

## 2024-05-13 DIAGNOSIS — E875 Hyperkalemia: Secondary | ICD-10-CM | POA: Diagnosis not present

## 2024-06-03 DIAGNOSIS — Z23 Encounter for immunization: Secondary | ICD-10-CM | POA: Diagnosis not present

## 2024-09-30 ENCOUNTER — Other Ambulatory Visit: Payer: Self-pay | Admitting: Internal Medicine

## 2024-09-30 DIAGNOSIS — Z1231 Encounter for screening mammogram for malignant neoplasm of breast: Secondary | ICD-10-CM

## 2024-12-27 ENCOUNTER — Ambulatory Visit
